# Patient Record
Sex: Male | Born: 1975 | Race: White | Hispanic: No | Marital: Married | Smoking: Current some day smoker
Health system: Southern US, Community
[De-identification: ages and names within clinical notes are randomized; demographics above are authoritative.]

## PROBLEM LIST (undated history)

## (undated) DIAGNOSIS — I471 Supraventricular tachycardia, unspecified: Secondary | ICD-10-CM

## (undated) DIAGNOSIS — G4730 Sleep apnea, unspecified: Secondary | ICD-10-CM

## (undated) HISTORY — PX: OTHER SURGICAL HISTORY: SHX169

---

## 2014-04-27 ENCOUNTER — Emergency Department: Payer: Self-pay | Admitting: Emergency Medicine

## 2014-05-06 ENCOUNTER — Emergency Department: Payer: Self-pay | Admitting: Emergency Medicine

## 2014-05-14 ENCOUNTER — Emergency Department: Payer: Self-pay | Admitting: Emergency Medicine

## 2014-05-16 ENCOUNTER — Emergency Department: Payer: Self-pay | Admitting: Emergency Medicine

## 2014-05-24 ENCOUNTER — Emergency Department: Payer: Self-pay | Admitting: Emergency Medicine

## 2014-05-30 ENCOUNTER — Emergency Department: Payer: Self-pay | Admitting: Emergency Medicine

## 2014-07-17 ENCOUNTER — Emergency Department: Payer: Self-pay | Admitting: Emergency Medicine

## 2014-09-03 ENCOUNTER — Emergency Department: Payer: Self-pay | Admitting: Emergency Medicine

## 2014-09-26 ENCOUNTER — Emergency Department: Payer: Self-pay | Admitting: Emergency Medicine

## 2014-10-18 ENCOUNTER — Emergency Department: Payer: Self-pay | Admitting: Emergency Medicine

## 2015-01-06 ENCOUNTER — Emergency Department
Admit: 2015-01-06 | Disposition: A | Discharge status: Home / Self Care | Payer: Self-pay | Admitting: Physician Assistant

## 2015-04-23 ENCOUNTER — Emergency Department
Admission: EM | Admit: 2015-04-23 | Discharge: 2015-04-23 | Disposition: A | Discharge status: Home / Self Care | Payer: Self-pay | Attending: Emergency Medicine | Admitting: Emergency Medicine

## 2015-04-23 ENCOUNTER — Encounter: Payer: Self-pay | Admitting: Emergency Medicine

## 2015-04-23 DIAGNOSIS — R000 Tachycardia, unspecified: Secondary | ICD-10-CM | POA: Insufficient documentation

## 2015-04-23 DIAGNOSIS — F1210 Cannabis abuse, uncomplicated: Secondary | ICD-10-CM | POA: Insufficient documentation

## 2015-04-23 DIAGNOSIS — Z720 Tobacco use: Secondary | ICD-10-CM | POA: Insufficient documentation

## 2015-04-23 HISTORY — DX: Supraventricular tachycardia: I47.1

## 2015-04-23 HISTORY — DX: Supraventricular tachycardia, unspecified: I47.10

## 2015-04-23 LAB — URINE DRUG SCREEN, QUALITATIVE (ARMC ONLY)
Amphetamines, Ur Screen: NOT DETECTED
Barbiturates, Ur Screen: NOT DETECTED
Benzodiazepine, Ur Scrn: NOT DETECTED
Cannabinoid 50 Ng, Ur ~~LOC~~: POSITIVE — AB
Cocaine Metabolite,Ur ~~LOC~~: NOT DETECTED
MDMA (Ecstasy)Ur Screen: NOT DETECTED
METHADONE SCREEN, URINE: NOT DETECTED
OPIATE, UR SCREEN: NOT DETECTED
Phencyclidine (PCP) Ur S: NOT DETECTED
Tricyclic, Ur Screen: NOT DETECTED

## 2015-04-23 LAB — COMPREHENSIVE METABOLIC PANEL
ALT: 45 U/L (ref 17–63)
AST: 43 U/L — ABNORMAL HIGH (ref 15–41)
Albumin: 3.8 g/dL (ref 3.5–5.0)
Alkaline Phosphatase: 46 U/L (ref 38–126)
Anion gap: 10 (ref 5–15)
BILIRUBIN TOTAL: 0.8 mg/dL (ref 0.3–1.2)
BUN: 12 mg/dL (ref 6–20)
CO2: 26 mmol/L (ref 22–32)
CREATININE: 1.01 mg/dL (ref 0.61–1.24)
Calcium: 8.5 mg/dL — ABNORMAL LOW (ref 8.9–10.3)
Chloride: 101 mmol/L (ref 101–111)
GFR calc non Af Amer: >60 mL/min (ref >60)
Glucose, Bld: 194 mg/dL — ABNORMAL HIGH (ref 65–99)
POTASSIUM: 4.2 mmol/L (ref 3.5–5.1)
Sodium: 137 mmol/L (ref 135–145)
Total Protein: 7.4 g/dL (ref 6.5–8.1)

## 2015-04-23 LAB — CBC
HCT: 48.2 % (ref 40.0–52.0)
Hemoglobin: 16 g/dL (ref 13.0–18.0)
MCH: 28.7 pg (ref 26.0–34.0)
MCHC: 33.3 g/dL (ref 32.0–36.0)
MCV: 86.2 fL (ref 80.0–100.0)
PLATELETS: 270 10*3/uL (ref 150–440)
RBC: 5.59 MIL/uL (ref 4.40–5.90)
RDW: 14.9 % — ABNORMAL HIGH (ref 11.5–14.5)
WBC: 14.2 10*3/uL — ABNORMAL HIGH (ref 3.8–10.6)

## 2015-04-23 LAB — TROPONIN I

## 2015-04-23 LAB — ETHANOL: ALCOHOL ETHYL (B): 154 mg/dL — AB (ref <5)

## 2015-04-23 MED ORDER — SODIUM CHLORIDE 0.9 % IV BOLUS (SEPSIS)
1000.0000 mL | Freq: Once | INTRAVENOUS | Status: AC
  Administered 2015-04-23: 1000 mL via INTRAVENOUS
  Filled 2015-04-23: qty 1000

## 2015-04-23 MED ORDER — KETOROLAC TROMETHAMINE 30 MG/ML IJ SOLN: 30.0000 mg | Freq: Once | INTRAMUSCULAR | Status: DC

## 2015-04-23 MED ORDER — KETOROLAC TROMETHAMINE 30 MG/ML IJ SOLN
INTRAMUSCULAR | Status: AC
  Filled 2015-04-23: qty 1

## 2015-04-23 MED ORDER — LIDOCAINE VISCOUS 2 % MT SOLN
15.0000 mL | Freq: Once | OROMUCOSAL | Status: AC
  Administered 2015-04-23: 15 mL via OROMUCOSAL
  Filled 2015-04-23: qty 15

## 2015-04-23 MED ORDER — KETOROLAC TROMETHAMINE 10 MG PO TABS
10.0000 mg | ORAL_TABLET | Freq: Once | ORAL | Status: AC
  Administered 2015-04-23: 10 mg via ORAL
  Filled 2015-04-23: qty 1
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
## 2015-04-23 NOTE — ED Notes (Signed)
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
## 2015-04-23 NOTE — Discharge Instructions (Signed)
Nonspecific Tachycardia  Tachycardia is a faster than normal heartbeat (more than 100 beats per minute). In adults, the heart normally beats between 60 and 100 times a minute. A fast heartbeat may be a normal response to exercise or stress. It does not necessarily mean that something is wrong. However, sometimes when your heart beats too fast it may not be able to pump enough blood to the rest of your body. This can result in chest pain, shortness of breath, dizziness, and even fainting. Nonspecific tachycardia means that the specific cause or pattern of your tachycardia is unknown.  CAUSES   Tachycardia may be harmless or it may be due to a more serious underlying cause. Possible causes of tachycardia include:  · Exercise or exertion.  · Fever.  · Pain or injury.  · Infection.  · Loss of body fluids (dehydration).  · Overactive thyroid.  · Lack of red blood cells (anemia).  · Anxiety and stress.  · Alcohol.  · Caffeine.  · Tobacco products.  · Diet pills.  · Illegal drugs.  · Heart disease.  SYMPTOMS  · Rapid or irregular heartbeat (palpitations).  · Suddenly feeling your heart beating (cardiac awareness).  · Dizziness.  · Tiredness (fatigue).  · Shortness of breath.  · Chest pain.  · Nausea.  · Fainting.  DIAGNOSIS   Your caregiver will perform a physical exam and take your medical history. In some cases, a heart specialist (cardiologist) may be consulted. Your caregiver may also order:  · Blood tests.  · Electrocardiography. This test records the electrical activity of your heart.  · A heart monitoring test.  TREATMENT   Treatment will depend on the likely cause of your tachycardia. The goal is to treat the underlying cause of your tachycardia. Treatment methods may include:  · Replacement of fluids or blood through an intravenous (IV) tube for moderate to severe dehydration or anemia.  · New medicines or changes in your current medicines.  · Diet and lifestyle changes.  · Treatment for certain  infections.  · Stress relief or relaxation methods.  HOME CARE INSTRUCTIONS   · Rest.  · Drink enough fluids to keep your urine clear or pale yellow.  · Do not smoke.  · Avoid:  ¨ Caffeine.  ¨ Tobacco.  ¨ Alcohol.  ¨ Chocolate.  ¨ Stimulants such as over-the-counter diet pills or pills that help you stay awake.  ¨ Situations that cause anxiety or stress.  ¨ Illegal drugs such as marijuana, phencyclidine (PCP), and cocaine.  · Only take medicine as directed by your caregiver.  · Keep all follow-up appointments as directed by your caregiver.  SEEK IMMEDIATE MEDICAL CARE IF:   · You have pain in your chest, upper arms, jaw, or neck.  · You become weak, dizzy, or feel faint.  · You have palpitations that will not go away.  · You vomit, have diarrhea, or pass blood in your stool.  · Your skin is cool, pale, and wet.  · You have a fever that will not go away with rest, fluids, and medicine.  MAKE SURE YOU:   · Understand these instructions.  · Will watch your condition.  · Will get help right away if you are not doing well or get worse.  Document Released: 10/22/2004 Document Revised: 12/07/2011 Document Reviewed: 08/25/2011  ExitCare® Patient Information ©2015 [employer]. This information is not intended to replace advice given to you by your health care provider. Make sure you discuss any questions   you have with your health care provider.

## 2015-06-04 ENCOUNTER — Encounter: Payer: Self-pay | Admitting: *Deleted

## 2015-06-04 ENCOUNTER — Emergency Department
Admission: EM | Admit: 2015-06-04 | Discharge: 2015-06-04 | Disposition: A | Discharge status: Home / Self Care | Payer: Self-pay | Attending: Emergency Medicine | Admitting: Emergency Medicine

## 2015-06-04 ENCOUNTER — Emergency Department: Payer: Self-pay

## 2015-06-04 DIAGNOSIS — Z720 Tobacco use: Secondary | ICD-10-CM | POA: Insufficient documentation

## 2015-06-04 DIAGNOSIS — R062 Wheezing: Secondary | ICD-10-CM | POA: Insufficient documentation

## 2015-06-04 DIAGNOSIS — R109 Unspecified abdominal pain: Secondary | ICD-10-CM | POA: Insufficient documentation

## 2015-06-04 HISTORY — DX: Sleep apnea, unspecified: G47.30

## 2015-06-04 LAB — COMPREHENSIVE METABOLIC PANEL
ALT: 27 U/L (ref 17–63)
ANION GAP: 8 (ref 5–15)
AST: 22 U/L (ref 15–41)
Albumin: 3.8 g/dL (ref 3.5–5.0)
Alkaline Phosphatase: 47 U/L (ref 38–126)
BUN: 9 mg/dL (ref 6–20)
CO2: 26 mmol/L (ref 22–32)
Calcium: 8.2 mg/dL — ABNORMAL LOW (ref 8.9–10.3)
Chloride: 94 mmol/L — ABNORMAL LOW (ref 101–111)
Creatinine, Ser: 0.78 mg/dL (ref 0.61–1.24)
Glucose, Bld: 125 mg/dL — ABNORMAL HIGH (ref 65–99)
POTASSIUM: 3.7 mmol/L (ref 3.5–5.1)
Sodium: 128 mmol/L — ABNORMAL LOW (ref 135–145)
Total Bilirubin: 0.7 mg/dL (ref 0.3–1.2)
Total Protein: 7.1 g/dL (ref 6.5–8.1)

## 2015-06-04 LAB — CBC
HCT: 47.7 % (ref 40.0–52.0)
Hemoglobin: 15.6 g/dL (ref 13.0–18.0)
MCH: 28 pg (ref 26.0–34.0)
MCHC: 32.8 g/dL (ref 32.0–36.0)
MCV: 85.6 fL (ref 80.0–100.0)
PLATELETS: 269 10*3/uL (ref 150–440)
RBC: 5.58 MIL/uL (ref 4.40–5.90)
RDW: 14.6 % — AB (ref 11.5–14.5)
WBC: 12.2 10*3/uL — AB (ref 3.8–10.6)

## 2015-06-04 LAB — URINALYSIS COMPLETE WITH MICROSCOPIC (ARMC ONLY)
BILIRUBIN URINE: NEGATIVE
Bacteria, UA: NONE SEEN
Glucose, UA: NEGATIVE mg/dL
Ketones, ur: NEGATIVE mg/dL
Leukocytes, UA: NEGATIVE
NITRITE: NEGATIVE
Protein, ur: 30 mg/dL — AB
Specific Gravity, Urine: 1.014 (ref 1.005–1.030)
pH: 7 (ref 5.0–8.0)

## 2015-06-04 LAB — LIPASE, BLOOD: LIPASE: 21 U/L — AB (ref 22–51)

## 2015-06-04 MED ORDER — OXYCODONE-ACETAMINOPHEN 5-325 MG PO TABS
2.0000 | ORAL_TABLET | Freq: Once | ORAL | Status: AC
  Administered 2015-06-04: 2 via ORAL

## 2015-06-04 MED ORDER — HYDROMORPHONE HCL 1 MG/ML IJ SOLN
INTRAMUSCULAR | Status: AC
  Administered 2015-06-04: 1 mg via INTRAMUSCULAR
  Filled 2015-06-04: qty 1

## 2015-06-04 MED ORDER — OXYCODONE-ACETAMINOPHEN 5-325 MG PO TABS
ORAL_TABLET | ORAL | Status: AC
  Filled 2015-06-04: qty 2

## 2015-06-04 MED ORDER — IPRATROPIUM-ALBUTEROL 0.5-2.5 (3) MG/3ML IN SOLN
RESPIRATORY_TRACT | Status: AC
  Administered 2015-06-04: 3 mL via RESPIRATORY_TRACT
  Filled 2015-06-04: qty 3

## 2015-06-04 MED ORDER — HYDROMORPHONE HCL 1 MG/ML IJ SOLN
1.0000 mg | Freq: Once | INTRAMUSCULAR | Status: AC
  Administered 2015-06-04: 1 mg via INTRAMUSCULAR

## 2015-06-04 MED ORDER — ALBUTEROL SULFATE HFA 108 (90 BASE) MCG/ACT IN AERS: 2.0000 | INHALATION_SPRAY | Freq: Four times a day (QID) | RESPIRATORY_TRACT | Status: AC | PRN

## 2015-06-04 MED ORDER — IPRATROPIUM-ALBUTEROL 0.5-2.5 (3) MG/3ML IN SOLN
3.0000 mL | Freq: Once | RESPIRATORY_TRACT | Status: AC
  Administered 2015-06-04: 3 mL via RESPIRATORY_TRACT

## 2015-06-04 MED ORDER — OXYCODONE-ACETAMINOPHEN 5-325 MG PO TABS: 1.0000 | ORAL_TABLET | Freq: Four times a day (QID) | ORAL | Status: AC | PRN

## 2015-06-04 NOTE — Discharge Instructions (Signed)
Flank Pain °Flank pain refers to pain that is located on the side of the body between the upper abdomen and the back. The pain may occur over a short period of time (acute) or may be long-term or reoccurring (chronic). It may be mild or severe. Flank pain can be caused by many things. °CAUSES  °Some of the more common causes of flank pain include: °· Muscle strains.   °· Muscle spasms.   °· A disease of your spine (vertebral disk disease).   °· A lung infection (pneumonia).   °· Fluid around your lungs (pulmonary edema).   °· A kidney infection.   °· Kidney stones.   °· A very painful skin rash caused by the chickenpox virus (shingles).   °· Gallbladder disease.   °HOME CARE INSTRUCTIONS  °Home care will depend on the cause of your pain. In general, °· Rest as directed by your caregiver. °· Drink enough fluids to keep your urine clear or pale yellow. °· Only take over-the-counter or prescription medicines as directed by your caregiver. Some medicines may help relieve the pain. °· Tell your caregiver about any changes in your pain. °· Follow up with your caregiver as directed. °SEEK IMMEDIATE MEDICAL CARE IF:  °· Your pain is not controlled with medicine.   °· You have new or worsening symptoms. °· Your pain increases.   °· You have abdominal pain.   °· You have shortness of breath.   °· You have persistent nausea or vomiting.   °· You have swelling in your abdomen.   °· You feel faint or pass out.   °· You have blood in your urine. °· You have a fever or persistent symptoms for more than 2-3 days. °· You have a fever and your symptoms suddenly get worse. °MAKE SURE YOU:  °· Understand these instructions. °· Will watch your condition. °· Will get help right away if you are not doing well or get worse. °Document Released: 11/05/2005 Document Revised: 06/08/2012 Document Reviewed: 04/28/2012 °ExitCare® Patient Information ©2015 [employer]. This information is not intended to replace advice given to you by your  health care provider. Make sure you discuss any questions you have with your health care provider. ° °

## 2015-06-04 NOTE — ED Notes (Signed)
Pt reports right flank pain radiating to side starting yesterday

## 2015-06-04 NOTE — ED Provider Notes (Signed)
[facility] Emergency Department Provider Note  Time seen: 5:19 PM  I have reviewed the triage vital signs and the nursing notes.   HISTORY  Chief Complaint Flank Pain    HPI Paul Turner is a 39 y.o. male with a past medical history of sleep apnea, SVT, presents the emergency department left flank pain. According to the patient he awoke around 3 AM with severe left flank pain, worse when he coughs. Patient denies any fever, dysuria, hematuria, diarrhea. States the pain is mild to moderate constantly, but becomes severe and sharp when he coughs or moves a certain way. He is not sure if he did something to it while he was sleeping. Denies any history of kidney stones.Describes the pain as 5/10, but 10/10 when he coughs. States nausea, denies vomiting.     Past Medical History  Diagnosis Date  . SVT (supraventricular tachycardia)   . Sleep apnea     There are no active problems to display for this patient.   Past Surgical History  Procedure Laterality Date  . Cardiac ablation      No current outpatient prescriptions on file.  Allergies Review of patient's allergies indicates no known allergies.  No family history on file.  Social History Social History  Substance Use Topics  . Smoking status: Current Some Day Smoker -- 0.50 packs/day    Types: Cigarettes  . Smokeless tobacco: Never Used  . Alcohol Use: Yes    Review of Systems Constitutional: Negative for fever. Cardiovascular: Negative for chest pain. Respiratory: Negative for shortness of breath. Gastrointestinal: Positive for left flank pain. Genitourinary: Negative for dysuria. Negative for hematuria. Musculoskeletal: Negative for back pain. Neurological: Negative for headache 10-point ROS otherwise negative.  ____________________________________________   PHYSICAL EXAM:  VITAL SIGNS: ED Triage Vitals  Enc Vitals Group     BP 06/04/15 1635 149/111 mmHg     Pulse Rate  06/04/15 1635 88     Resp 06/04/15 1635 22     Temp 06/04/15 1635 98.4 F (36.9 C)     Temp Source 06/04/15 1635 Oral     SpO2 06/04/15 1635 96 %     Weight 06/04/15 1635 340 lb (154.223 kg)     Height 06/04/15 1635 6' (1.829 m)     Head Cir --      Peak Flow --      Pain Score 06/04/15 1636 10     Pain Loc --      Pain Edu? --      Excl. in GC? --     Constitutional: Alert and oriented. Well appearing and in no distress. Eyes: Normal exam ENT   Head: Normocephalic and atraumatic. Cardiovascular: Normal rate, regular rhythm. No murmur Respiratory: Patient with bilateral wheeze, especially expiratory. No rales or rhonchi identified. Gastrointestinal: Soft and nontender. No distention. No CVA tenderness palpation. Musculoskeletal: Nontender with normal range of motion in all extremities. No lower extremity tenderness or edema. Neurologic:  Normal speech and language. No gross focal neurologic deficits Skin:  Skin is warm, dry and intact.  Psychiatric: Mood and affect are normal. Speech and behavior are normal. ____________________________________________    RADIOLOGY  CT shows no acute findings.  ____________________________________________   INITIAL IMPRESSION / ASSESSMENT AND PLAN / ED COURSE  Pertinent labs & imaging results that were available during my care of the patient were reviewed by me and considered in my medical decision making (see chart for details).  Patient with significant left flank pain since  3 AM. Nontender exam, no CVA tenderness. No abdominal tenderness or back tenderness. We will proceed with labs, CT abdomen and pelvis to help further evaluate. Patient agreeable to plan. We will dose Dilaudid IM for pain relief. We will also provide a DuoNeb given bilateral wheeze on exam. The patient admits smoking, does not know if he has COPD because he does not see a physician.  CT shows no acute findings. We will discharge home with Percocet for pain control,  albuterol for wheeze, and patient is follow up with a primary care doctor. I discussed strict return precautions with the patient for worsening pain, fever, vomiting, patient is agreeable to plan. ____________________________________________   FINAL CLINICAL IMPRESSION(S) / ED DIAGNOSES  Left flank pain Wheeze   Minna Antis, MD 06/04/15 [phone]

## 2015-06-04 NOTE — ED Notes (Signed)
Pt reports spilling duoneb and is requesting another at this time. Pt is also requesting a nicatin patch at this time.

## 2015-06-04 NOTE — ED Notes (Signed)
Pt reports LUQ pain beginning in the middle of the night. Pt reports pain is now radiating to the Left back. Pt denies pain with urination but reports pain increases with coughing. Pt has bilateral wheezing and SOB that pt reports he has had for the past "20 years". Pt is in no acute distress but is coughing at this time.

## 2015-10-01 ENCOUNTER — Emergency Department
Admission: EM | Admit: 2015-10-01 | Discharge: 2015-10-01 | Disposition: A | Discharge status: Home / Self Care | Payer: Self-pay | Attending: Emergency Medicine | Admitting: Emergency Medicine

## 2015-10-01 ENCOUNTER — Emergency Department: Payer: Self-pay

## 2015-10-01 ENCOUNTER — Encounter: Payer: Self-pay | Admitting: Emergency Medicine

## 2015-10-01 DIAGNOSIS — W06XXXA Fall from bed, initial encounter: Secondary | ICD-10-CM | POA: Insufficient documentation

## 2015-10-01 DIAGNOSIS — R0981 Nasal congestion: Secondary | ICD-10-CM

## 2015-10-01 DIAGNOSIS — G4730 Sleep apnea, unspecified: Secondary | ICD-10-CM

## 2015-10-01 DIAGNOSIS — S0990XA Unspecified injury of head, initial encounter: Secondary | ICD-10-CM | POA: Insufficient documentation

## 2015-10-01 DIAGNOSIS — R519 Headache, unspecified: Secondary | ICD-10-CM

## 2015-10-01 DIAGNOSIS — K029 Dental caries, unspecified: Secondary | ICD-10-CM

## 2015-10-01 DIAGNOSIS — Y9289 Other specified places as the place of occurrence of the external cause: Secondary | ICD-10-CM | POA: Insufficient documentation

## 2015-10-01 DIAGNOSIS — R51 Headache: Secondary | ICD-10-CM

## 2015-10-01 DIAGNOSIS — Y9389 Activity, other specified: Secondary | ICD-10-CM | POA: Insufficient documentation

## 2015-10-01 DIAGNOSIS — F17210 Nicotine dependence, cigarettes, uncomplicated: Secondary | ICD-10-CM | POA: Insufficient documentation

## 2015-10-01 DIAGNOSIS — S0121XA Laceration without foreign body of nose, initial encounter: Secondary | ICD-10-CM | POA: Insufficient documentation

## 2015-10-01 DIAGNOSIS — Y998 Other external cause status: Secondary | ICD-10-CM | POA: Insufficient documentation

## 2015-10-01 DIAGNOSIS — J029 Acute pharyngitis, unspecified: Secondary | ICD-10-CM | POA: Insufficient documentation

## 2015-10-01 MED ORDER — FEXOFENADINE-PSEUDOEPHED ER 60-120 MG PO TB12: 1.0000 | ORAL_TABLET | Freq: Two times a day (BID) | ORAL | Status: AC

## 2015-10-01 MED ORDER — TRAMADOL HCL 50 MG PO TABS
50.0000 mg | ORAL_TABLET | Freq: Once | ORAL | Status: AC
  Administered 2015-10-01: 50 mg via ORAL
  Filled 2015-10-01: qty 1

## 2015-10-01 MED ORDER — IBUPROFEN 800 MG PO TABS: 800.0000 mg | ORAL_TABLET | Freq: Three times a day (TID) | ORAL | Status: AC | PRN

## 2015-10-01 MED ORDER — IBUPROFEN 800 MG PO TABS
800.0000 mg | ORAL_TABLET | Freq: Once | ORAL | Status: AC
  Administered 2015-10-01: 800 mg via ORAL
  Filled 2015-10-01: qty 1

## 2015-10-01 MED ORDER — TRAMADOL HCL 50 MG PO TABS: 50.0000 mg | ORAL_TABLET | Freq: Four times a day (QID) | ORAL | Status: AC | PRN

## 2015-10-01 MED ORDER — AMOXICILLIN 500 MG PO CAPS: 500.0000 mg | ORAL_CAPSULE | Freq: Three times a day (TID) | ORAL | Status: AC

## 2015-10-01 NOTE — Discharge Instructions (Signed)
Follow-up from the list of dental clinics provided. °OPTIONS FOR DENTAL FOLLOW UP CARE ° °[place] [facility] Health and Human Services - Local Safety Net Dental Clinics °http://www.ncdhhs.gov/dph/oralhealth/services/safetynetclinics.htm °  °[facility] (336-562-3123) ° °[facility] [place] ([phone]) ° °[facility] ([phone] ext [phone]) ° °[facility] ([phone]) ° °[facility] ([phone]) °This clinic caters to the indigent population and is on a lottery system. °Location: °[facility], [address], [place] °Clinic Hours: °Wednesdays from 6pm - 9pm, patients seen by a lottery system. °For dates, call or go to www.med.[facility].edu/[facility]/patients/Dental-[facility] °Services: °Cleanings, fillings and simple extractions. °Payment Options: °DENTAL WORK IS FREE OF CHARGE. Bring proof of income or support. °Best way to get seen: °Arrive at 5:15 pm - this is a lottery, NOT first come/first serve, so arriving earlier will not increase your chances of being seen. °  °  °[facility] °[phone] °Select option 1 for emergencies °  °Location: °[facility], [address], [place] °Clinic Hours: °No walk-ins accepted - call the day before to schedule an appointment. °Check in times are 9:30 am and 1:30 pm. °Services: °Simple extractions, temporary fillings, pulpectomy/pulp debridement, uncomplicated abscess drainage. °Payment Options: °PAYMENT IS DUE AT THE TIME OF SERVICE.  Fee is usually $100-200, additional surgical procedures (e.g. abscess drainage) may be extra. °Cash, checks, Visa/MasterCard accepted.  Can file Medicaid if patient is covered for dental - patient should call case worker to check. °No discount for [facility] patients. °Best way to get seen: °MUST call the day before and get onto the schedule. Can usually be seen the next 1-2 days. No walk-ins accepted. °  °   °[facility] °[phone] °  °Location: °[facility], [address], [place] °Clinic Hours: °M, W, Th, F 8am or 1:30pm, Tues 9a or 1:30 - first come/first served. °Services: °Simple extractions, temporary fillings, uncomplicated abscess drainage.  You do not need to be an [place] resident. °Payment Options: °PAYMENT IS DUE AT THE TIME OF SERVICE. °Dental insurance, otherwise sliding scale - bring proof of income or support. °Depending on income and treatment needed, cost is usually $50-200. °Best way to get seen: °Arrive early as it is first come/first served. °  °  °[facility] °[phone] °  °Location: °[address] °Clinic Hours: °Mon-Thu 8a-5p °Services: °Most basic dental services including extractions and fillings. °Payment Options: °PAYMENT IS DUE AT THE TIME OF SERVICE. °Sliding scale, up to 50% off - bring proof if income or support. °Medicaid with dental option accepted. °Best way to get seen: °Call to schedule an appointment, can usually be seen within 2 weeks OR they will try to see walk-ins - show up at 8a or 2p (you may have to wait). °  °  °[facility] °[phone] °[place] RESIDENTS ONLY °  °Location: °[facility], [address], [place], [place] [address] °Clinic Hours: By appointment only. °Monday - Thursday 8am-5pm, Friday 8am-12pm °Services: Cleanings, fillings, extractions. °Payment Options: °PAYMENT IS DUE AT THE TIME OF SERVICE. °Cash, Visa or MasterCard. Sliding scale - $30 minimum per service. °Best way to get seen: °Come in to office, complete packet and make an appointment - need proof of income °or support monies for each household member and proof of [place] residence. °Usually takes about a month to get in. °  °  °[facility] °[phone] °  °Location: °[address] [place] °Clinic Hours: Walk-in Urgent Care  Dental Services are offered Monday-Friday mornings only. °The numbers of emergencies accepted daily is limited to the number of °providers available. °Maximum 15 - Mondays, Wednesdays & Thursdays °Maximum 10 - Tuesdays & Fridays °Services: °You do not need to be a [place] resident to be seen for a dental emergency. °Emergencies are defined as pain, swelling, abnormal bleeding, or dental trauma. Walkins will receive x-rays if needed. °NOTE: Dental cleaning is not an emergency. °Payment Options: °PAYMENT IS DUE AT THE TIME OF SERVICE. °Minimum co-pay is $40.00 for uninsured patients. °Minimum co-pay is $3.00 for Medicaid with dental coverage. °Dental Insurance is accepted and must be presented at time of visit. °Medicare does not cover dental. °Forms of payment: Cash, credit card, checks. °Best way to get seen: °If not previously registered with the clinic, walk-in dental registration begins at 7:15 am and is on a first come/first serve basis. °If previously registered with the clinic, call to make an appointment. °  °  °[facility] °[phone] °[place] RESIDENTS ONLY °  °Location: °[address], [place], [place] °Clinic Hours: °Mon-Thu 10a-2p °Services: Extractions only! °Payment Options: °FREE (donations accepted) - bring proof of income or support °Best way to get seen: °Call and schedule an appointment OR come at 8am on the 1st Monday of every month (except for holidays) when it is first come/first served. °  °  °[facility] °[phone] °  °Location: °[address], [place] °Clinic Hours: °Friday mornings °Services, Payment Options, Best way to get seen: °Call for info ° °

## 2015-10-01 NOTE — ED Notes (Signed)
Pt reports to ED w/ multiple problems that he would like to be seen for.  Pt sts that he's had pneumonia for 3 weeks, coughing up yellow phlegm and that he gets "goat throat".  Pt also c/o sleep issues, stating that he's only half asleep.  Pt also sts that he has a rotten tooth and that it is causing pain to R side of face.  Pts denies seeing PCP or dentist for issues.

## 2015-10-01 NOTE — ED Notes (Signed)
Pt here with c/o cough for 3 weeks, thick mucus sputum, fell out of bed last pm, skin tear scabbed over noted to bridge of nose, tooth ache past few weeks as well.

## 2015-10-01 NOTE — ED Provider Notes (Signed)
[facility] Emergency Department Provider Note  ____________________________________________  Time seen: Approximately 3:43 PM  I have reviewed the triage vital signs and the nursing notes.   HISTORY  Chief Complaint Cough and Fall    HPI Paul Turner is a 40 y.o. male patient presented with multiple complaints. Patient complain of a laceration superior aspect of his nose  secondary fall out of be an early this morning. Patient denies any loss of consciousness or nasal hemorrhaging. Patient denies any difficulty breathing. Patient states productive cough for 3 weeks and sore throat. Patient also states insomnia. Patient has dental pain secondary to a devitalized teeth.Patient rates his pain discomfort as a 10 over 10. No palliative measures for these complaints. Patient has not seen family doctor clinic or dentist for these complaints.  Past Medical History  Diagnosis Date  . SVT (supraventricular tachycardia) (HCC)   . Sleep apnea     There are no active problems to display for this patient.   Past Surgical History  Procedure Laterality Date  . Cardiac ablation      Current Outpatient Rx  Name  Route  Sig  Dispense  Refill  . albuterol (PROVENTIL HFA;VENTOLIN HFA) 108 (90 BASE) MCG/ACT inhaler   Inhalation   Inhale 2 puffs into the lungs every 6 (six) hours as needed for wheezing or shortness of breath.   1 Inhaler   2   . amoxicillin (AMOXIL) 500 MG capsule   Oral   Take 1 capsule (500 mg total) by mouth 3 (three) times daily.   30 capsule   0   . fexofenadine-pseudoephedrine (ALLEGRA-D) 60-120 MG 12 hr tablet   Oral   Take 1 tablet by mouth 2 (two) times daily.   30 tablet   0   . ibuprofen (ADVIL,MOTRIN) 800 MG tablet   Oral   Take 1 tablet (800 mg total) by mouth every 8 (eight) hours as needed.   30 tablet   0   . oxyCODONE-acetaminophen (ROXICET) 5-325 MG per tablet   Oral   Take 1 tablet by mouth every 6 (six) hours as  needed.   20 tablet   0   . traMADol (ULTRAM) 50 MG tablet   Oral   Take 1 tablet (50 mg total) by mouth every 6 (six) hours as needed.   20 tablet   0     Allergies Review of patient's allergies indicates no known allergies.  No family history on file.  Social History Social History  Substance Use Topics  . Smoking status: Current Some Day Smoker -- 0.50 packs/day    Types: Cigarettes  . Smokeless tobacco: Never Used  . Alcohol Use: Yes    Review of Systems Constitutional: No fever/chills Eyes: No visual changes. ENT: Sore throat and dental pain. Nasal congestion and laceration Cardiovascular: Denies chest pain. Respiratory: Denies shortness of breath. Productive cough Gastrointestinal: No abdominal pain.  No nausea, no vomiting.  No diarrhea.  No constipation. Genitourinary: Negative for dysuria. Musculoskeletal: Negative for back pain. Skin: Negative for rash. Laceration superior aspect nasal area Neurological: Negative for headaches, focal weakness or numbness. 10-point ROS otherwise negative.  ____________________________________________   PHYSICAL EXAM:  VITAL SIGNS: ED Triage Vitals  Enc Vitals Group     BP 10/01/15 1506 145/91 mmHg     Pulse Rate 10/01/15 1506 90     Resp 10/01/15 1506 20     Temp 10/01/15 1506 98.8 F (37.1 C)     Temp Source 10/01/15 1506  Oral     SpO2 10/01/15 1506 96 %     Weight 10/01/15 1506 320 lb (145.151 kg)     Height 10/01/15 1506 6' (1.829 m)     Head Cir --      Peak Flow --      Pain Score 10/01/15 1507 10     Pain Loc --      Pain Edu? --      Excl. in GC? --     Constitutional: Alert and oriented. Well appearing and in no acute distress. Morbid obesity Eyes: Conjunctivae are normal. PERRL. EOMI. Head: Atraumatic. Nose: No congestion/rhinnorhea. Laceration superior aspect. Bilateral edematous nasal turbinates. Clear rhinorrhea Mouth/Throat: Mucous membranes are moist.  Oropharynx non-erythematous. Devitalized  tooth #3. Neck: No stridor.  No cervical spine tenderness to palpation. Hematological/Lymphatic/Immunilogical: No cervical lymphadenopathy. Cardiovascular: Normal rate, regular rhythm. Grossly normal heart sounds.  Good peripheral circulation. Respiratory: Normal respiratory effort.  No retractions. Lungs CTAB. Nonproductive cough Gastrointestinal: Soft and nontender. No distention. No abdominal bruits. No CVA tenderness. Musculoskeletal: No lower extremity tenderness nor edema.  No joint effusions. Neurologic:  Normal speech and language. No gross focal neurologic deficits are appreciated. No gait instability. Skin:  Skin is warm, dry and intact. No rash noted. Psychiatric: Mood and affect are normal. Speech and behavior are normal.  ____________________________________________   LABS (all labs ordered are listed, but only abnormal results are displayed)  Labs Reviewed - No data to display ____________________________________________  EKG   ____________________________________________  RADIOLOGY  No acute findings on chest x-ray ____________________________________________   PROCEDURES  Procedure(s) performed: None  Critical Care performed: No  ____________________________________________   INITIAL IMPRESSION / ASSESSMENT AND PLAN / ED COURSE  Pertinent labs & imaging results that were available during my care of the patient were reviewed by me and considered in my medical decision making (see chart for details).  Nasal laceration. Sinus congestion. Dental pain secondary to caries. Negative findings on chest x-ray was discussed with patient.. ____________________________________________   FINAL CLINICAL IMPRESSION(S) / ED DIAGNOSES  Final diagnoses:  Sinus congestion  Sinus headache  Pain due to dental caries  Sleep apnea in adult      Joni ReiningRonald K Melisa Donofrio, PA-C 10/01/15 1626  Darien Ramusavid W Kaminski, MD 10/01/15 [phone]

## 2016-01-27 ENCOUNTER — Emergency Department: Payer: Self-pay

## 2016-01-27 ENCOUNTER — Encounter: Payer: Self-pay | Admitting: Emergency Medicine

## 2016-01-27 ENCOUNTER — Emergency Department
Admission: EM | Admit: 2016-01-27 | Discharge: 2016-01-27 | Disposition: A | Discharge status: Home / Self Care | Payer: Self-pay | Attending: Emergency Medicine | Admitting: Emergency Medicine

## 2016-01-27 DIAGNOSIS — F17210 Nicotine dependence, cigarettes, uncomplicated: Secondary | ICD-10-CM | POA: Insufficient documentation

## 2016-01-27 DIAGNOSIS — F1290 Cannabis use, unspecified, uncomplicated: Secondary | ICD-10-CM | POA: Insufficient documentation

## 2016-01-27 DIAGNOSIS — R109 Unspecified abdominal pain: Secondary | ICD-10-CM | POA: Insufficient documentation

## 2016-01-27 DIAGNOSIS — Z79899 Other long term (current) drug therapy: Secondary | ICD-10-CM | POA: Insufficient documentation

## 2016-01-27 LAB — CBC WITH DIFFERENTIAL/PLATELET
Basophils Absolute: 0.1 10*3/uL (ref 0–0.1)
Basophils Relative: 1 %
Eosinophils Absolute: 0.2 10*3/uL (ref 0–0.7)
Eosinophils Relative: 2 %
HEMATOCRIT: 46.2 % (ref 40.0–52.0)
Hemoglobin: 15 g/dL (ref 13.0–18.0)
LYMPHS PCT: 16 %
Lymphs Abs: 1.8 10*3/uL (ref 1.0–3.6)
MCH: 27.5 pg (ref 26.0–34.0)
MCHC: 32.6 g/dL (ref 32.0–36.0)
MCV: 84.5 fL (ref 80.0–100.0)
Monocytes Absolute: 1 10*3/uL (ref 0.2–1.0)
Monocytes Relative: 9 %
NEUTROS PCT: 72 %
Neutro Abs: 8.1 10*3/uL — ABNORMAL HIGH (ref 1.4–6.5)
Platelets: 290 10*3/uL (ref 150–440)
RBC: 5.46 MIL/uL (ref 4.40–5.90)
RDW: 14.8 % — AB (ref 11.5–14.5)
WBC: 11.2 10*3/uL — ABNORMAL HIGH (ref 3.8–10.6)

## 2016-01-27 LAB — COMPREHENSIVE METABOLIC PANEL
ALT: 43 U/L (ref 17–63)
AST: 35 U/L (ref 15–41)
Albumin: 3.9 g/dL (ref 3.5–5.0)
Alkaline Phosphatase: 47 U/L (ref 38–126)
Anion gap: 7 (ref 5–15)
BILIRUBIN TOTAL: 0.9 mg/dL (ref 0.3–1.2)
BUN: 10 mg/dL (ref 6–20)
CALCIUM: 8.5 mg/dL — AB (ref 8.9–10.3)
CO2: 29 mmol/L (ref 22–32)
Chloride: 101 mmol/L (ref 101–111)
Creatinine, Ser: 0.76 mg/dL (ref 0.61–1.24)
GFR calc Af Amer: >60 mL/min (ref >60)
Glucose, Bld: 135 mg/dL — ABNORMAL HIGH (ref 65–99)
POTASSIUM: 4.1 mmol/L (ref 3.5–5.1)
Sodium: 137 mmol/L (ref 135–145)
TOTAL PROTEIN: 7.3 g/dL (ref 6.5–8.1)

## 2016-01-27 LAB — URINALYSIS COMPLETE WITH MICROSCOPIC (ARMC ONLY)
BILIRUBIN URINE: NEGATIVE
Bacteria, UA: NONE SEEN
GLUCOSE, UA: NEGATIVE mg/dL
KETONES UR: NEGATIVE mg/dL
Nitrite: NEGATIVE
PROTEIN: 100 mg/dL — AB
SPECIFIC GRAVITY, URINE: 1.017 (ref 1.005–1.030)
pH: 8 (ref 5.0–8.0)

## 2016-01-27 MED ORDER — ACETAMINOPHEN 500 MG PO TABS
1000.0000 mg | ORAL_TABLET | ORAL | Status: DC
  Filled 2016-01-27: qty 2

## 2016-01-27 MED ORDER — IBUPROFEN 800 MG PO TABS
800.0000 mg | ORAL_TABLET | ORAL | Status: DC
  Filled 2016-01-27: qty 1

## 2016-01-27 NOTE — ED Notes (Addendum)
Pt refused to sign discharge, refused to let staff get vitals. Pt walked out. Pt ambulatory to discharge with no difficulty noted; pt with even and nonlabored respirations noted.

## 2016-01-27 NOTE — ED Notes (Signed)
Pt does njot have a pcp and no insuance.

## 2016-01-27 NOTE — ED Provider Notes (Signed)
[facility] Emergency Department Provider Note  ____________________________________________  Time seen: Approximately 4:00 PM  I have reviewed the triage vital signs and the nursing notes.   HISTORY  Chief Complaint Flank Pain    HPI Valon Glasscock is a 40 y.o. male previous history of SVT. Also morbid obesity.  Patient reports that he's been having aching pain in his left back and left flank for several days now. Worse with sitting up, twisting and moving. He has not noticed any blood in his urine. No nausea or vomiting. He states he really doesn't feel like any discomfort in his abdomen, rather it is mostly along the muscles of the left back. There is no ripping tearing or movement of the pain. No numbness or weakness in the legs or hands.  No fevers.   Past Medical History  Diagnosis Date  . SVT (supraventricular tachycardia) (HCC)   . Sleep apnea     There are no active problems to display for this patient.   Past Surgical History  Procedure Laterality Date  . Cardiac ablation      Current Outpatient Rx  Name  Route  Sig  Dispense  Refill  . albuterol (PROVENTIL HFA;VENTOLIN HFA) 108 (90 BASE) MCG/ACT inhaler   Inhalation   Inhale 2 puffs into the lungs every 6 (six) hours as needed for wheezing or shortness of breath.   1 Inhaler   2   . amoxicillin (AMOXIL) 500 MG capsule   Oral   Take 1 capsule (500 mg total) by mouth 3 (three) times daily.   30 capsule   0   . fexofenadine-pseudoephedrine (ALLEGRA-D) 60-120 MG 12 hr tablet   Oral   Take 1 tablet by mouth 2 (two) times daily.   30 tablet   0   . ibuprofen (ADVIL,MOTRIN) 800 MG tablet   Oral   Take 1 tablet (800 mg total) by mouth every 8 (eight) hours as needed.   30 tablet   0   . oxyCODONE-acetaminophen (ROXICET) 5-325 MG per tablet   Oral   Take 1 tablet by mouth every 6 (six) hours as needed.   20 tablet   0   . traMADol (ULTRAM) 50 MG tablet   Oral   Take 1  tablet (50 mg total) by mouth every 6 (six) hours as needed.   20 tablet   0     Allergies Review of patient's allergies indicates no known allergies.  History reviewed. No pertinent family history.  Social History Social History  Substance Use Topics  . Smoking status: Current Some Day Smoker -- 0.50 packs/day    Types: Cigarettes  . Smokeless tobacco: Never Used  . Alcohol Use: Yes    Review of Systems Constitutional: No fever/chills Eyes: No visual changes. ENT: No sore throat. Cardiovascular: Denies chest pain. Respiratory: Denies shortness of breath. Gastrointestinal: No abdominal pain.  No nausea, no vomiting.  No diarrhea.  No constipation. Genitourinary: Negative for dysuria. Musculoskeletal: Negative for low or upper back pain. Some pain and discomfort in the left flank along the side of the back. Skin: Negative for rash. Neurological: Negative for headaches, focal weakness or numbness.  10-point ROS otherwise negative.  ____________________________________________   PHYSICAL EXAM:  VITAL SIGNS: ED Triage Vitals  Enc Vitals Group     BP 01/27/16 1247 126/95 mmHg     Pulse Rate 01/27/16 1247 83     Resp 01/27/16 1247 20     Temp 01/27/16 1247 98.2 F (36.8  C)     Temp Source 01/27/16 1247 Oral     SpO2 01/27/16 1247 100 %     Weight 01/27/16 1247 370 lb (167.831 kg)     Height 01/27/16 1247 6' (1.829 m)     Head Cir --      Peak Flow --      Pain Score 01/27/16 1247 10     Pain Loc --      Pain Edu? --      Excl. in GC? --    Constitutional: Alert and oriented. Well appearing and in no acute distress. Resting in the bed, easily aroused. Pleasant. Eyes: Conjunctivae are normal. PERRL. EOMI. Head: Atraumatic. Nose: No congestion/rhinnorhea. Mouth/Throat: Mucous membranes are moist.  Oropharynx non-erythematous. Neck: No stridor.   Cardiovascular: Normal rate, regular rhythm. Grossly normal heart sounds.  Good peripheral circulation. Respiratory:  Normal respiratory effort.  No retractions. Lungs CTAB. Gastrointestinal: Morbid obesity. Soft reducible ventral hernia. Nontender. Soft and nontender. No distention. No abdominal bruits. No CVA tenderness. The patient has a fairly large pannus, however no evidence of erythema or abscess. The testicles are nontender bilaterally. No groin mass. Musculoskeletal: No lower extremity tenderness nor edema.  No joint effusions. There is modest tenderness along the left thoracic and upper lumbar paraspinous muscles. Patient's pain is worse when he goes to sit up, twist or stand. Neurologic:  Normal speech and language. No gross focal neurologic deficits are appreciated. No gait instability. Skin:  Skin is warm, dry and intact. No rash noted. Psychiatric: Mood and affect are normal. Speech and behavior are normal.  ____________________________________________   LABS (all labs ordered are listed, but only abnormal results are displayed)  Labs Reviewed  CBC WITH DIFFERENTIAL/PLATELET - Abnormal; Notable for the following:    WBC 11.2 (*)    RDW 14.8 (*)    Neutro Abs 8.1 (*)    All other components within normal limits  COMPREHENSIVE METABOLIC PANEL - Abnormal; Notable for the following:    Glucose, Bld 135 (*)    Calcium 8.5 (*)    All other components within normal limits  URINALYSIS COMPLETEWITH MICROSCOPIC ([facility] ONLY) - Abnormal; Notable for the following:    Color, Urine YELLOW (*)    APPearance CLEAR (*)    Hgb urine dipstick 1+ (*)    Protein, ur 100 (*)    Leukocytes, UA 1+ (*)    Squamous Epithelial / LPF 0-5 (*)    All other components within normal limits  URINE CULTURE   ____________________________________________  EKG  ____________________________________________  RADIOLOGY   DG Abd 1 View (Final result) Result time: 01/27/16 16:43:16   Final result by Rad Results In Interface (01/27/16 16:43:16)   Narrative:   CLINICAL DATA: Reports left flank pain x 2  days  EXAM: ABDOMEN - 1 VIEW  COMPARISON: None.  FINDINGS: The bowel gas pattern is normal. No radio-opaque calculi or other significant radiographic abnormality are seen.  IMPRESSION: Negative.   Electronically Signed By: Esperanza Heiraymond Rubner M.D. On: 01/27/2016 16:43        ____________________________________________   PROCEDURES  Procedure(s) performed: None  Critical Care performed: No  ____________________________________________   INITIAL IMPRESSION / ASSESSMENT AND PLAN / ED COURSE  Pertinent labs & imaging results that were available during my care of the patient were reviewed by me and considered in my medical decision making (see chart for details).  Patient presents with left flank discomfort. Symptoms appear to be most consistent with musculoskeletal type pain. Discussed with  the patient, and we also discussed further evaluation such as obtaining a CT scan to evaluate for other causes such as abdominal pain, kidney stones, infection, ulcer, etc. however the patient does not fit in our CT scan as his weight is greater than the table limit. He is agreeable with the plan to treat his discomfort with over-the-counter medication, rest and watchful waiting. Should he develop abdominal pain, fevers, worsening symptoms, numbness weakness chest pain or other new concerns arise he'll return to emergency room right away.  No ripping or tearing or moving pain to suggest dissection. Normal peripheral pulses. Patient is young, no midline back or abdominal pain to suggest possible or aneurysm. No fever, mild but appears to be chronic leukocytosis. No right lower quadrant pain or right upper quadrant discomfort. No surgical signs or symptoms on examination. No evidence of cholecystitis, cholelithiasis by exam, appendicitis, or other clinical history to suggest acute intra-abdominal catastrophe or infection.  ----------------------------------------- 5:27 PM on  01/27/2016 -----------------------------------------  Patient ambulating well, reports some relief. On repeat exam no tenderness along the left flank or abdomen, does have tenderness along the left posterior paraspinous muscles. I discussed with him that to further evaluate we would require further imaging like a CT scan, but due to his weight we would need to transfer him to [facility] to perform this. He did not wish to do this, and I think it is reasonable to provide him with careful return precautions which we reviewed he will come back right away if worsening symptoms or new concerns.  He is currently awake, alert and ambulating without distress in the ER. He was able to eat crackers and water without problem. ____________________________________________   FINAL CLINICAL IMPRESSION(S) / ED DIAGNOSES  Final diagnoses:  Acute left flank pain      Sharyn Creamer, MD 01/27/16 1740

## 2016-01-27 NOTE — ED Notes (Signed)
Reports left flank pain.  Denies vomiting.

## 2016-01-27 NOTE — Discharge Instructions (Signed)
You were seen in the emergency room for pain along the left flank and back. It is important that you follow up closely with your primary care doctor in the next couple of days.  If you're unable to see a primary care doctor you may return to the emergency room or go to the [facility] in 1 or 2 days for reexam.  Please return to the emergency room right away if you are to develop a fever, severe nausea, your pain becomes severe or worsens, you are unable to keep food down, begin vomiting any dark or bloody fluid, you develop any dark or bloody stools, feel dehydrated, or other new concerns or symptoms arise.   Flank Pain Flank pain refers to pain that is located on the side of the body between the upper abdomen and the back. The pain may occur over a short period of time (acute) or may be long-term or reoccurring (chronic). It may be mild or severe. Flank pain can be caused by many things. CAUSES  Some of the more common causes of flank pain include:  Muscle strains.   Muscle spasms.   A disease of your spine (vertebral disk disease).   A lung infection (pneumonia).   Fluid around your lungs (pulmonary edema).   A kidney infection.   Kidney stones.   A very painful skin rash caused by the chickenpox virus (shingles).   Gallbladder disease.  HOME CARE INSTRUCTIONS  Home care will depend on the cause of your pain. In general,  Rest as directed by your caregiver.  Drink enough fluids to keep your urine clear or pale yellow.  Only take over-the-counter or prescription medicines as directed by your caregiver. Some medicines may help relieve the pain.  Tell your caregiver about any changes in your pain.  Follow up with your caregiver as directed. SEEK IMMEDIATE MEDICAL CARE IF:   Your pain is not controlled with medicine.   You have new or worsening symptoms.  Your pain increases.   You have abdominal pain.   You have shortness of breath.   You  have persistent nausea or vomiting.   You have swelling in your abdomen.   You feel faint or pass out.   You have blood in your urine.  You have a fever or persistent symptoms for more than 2-3 days.  You have a fever and your symptoms suddenly get worse. MAKE SURE YOU:   Understand these instructions.  Will watch your condition.  Will get help right away if you are not doing well or get worse.   This information is not intended to replace advice given to you by your health care provider. Make sure you discuss any questions you have with your health care provider.   Document Released: 11/05/2005 Document Revised: 06/08/2012 Document Reviewed: 04/28/2012 Elsevier Interactive Patient Education [employer]2016 [employer] Inc.

## 2016-01-27 NOTE — ED Notes (Signed)
Pt refused medications.

## 2016-01-29 LAB — URINE CULTURE
Culture: 50000 — AB
Special Requests: NORMAL

## 2016-01-30 LAB — GLUCOSE, CAPILLARY: GLUCOSE-CAPILLARY: 140 mg/dL — AB (ref 65–99)

## 2016-03-21 ENCOUNTER — Emergency Department (HOSPITAL_COMMUNITY)
Admission: EM | Admit: 2016-03-21 | Discharge: 2016-03-28 | Disposition: E | Payer: Self-pay | Attending: Emergency Medicine | Admitting: Emergency Medicine

## 2016-03-21 DIAGNOSIS — I469 Cardiac arrest, cause unspecified: Secondary | ICD-10-CM | POA: Insufficient documentation

## 2016-03-21 MED ORDER — SODIUM CHLORIDE 0.9 % IV SOLN
INTRAVENOUS | Status: AC | PRN
  Administered 2016-03-21: 1000 mL via INTRAVENOUS

## 2016-03-21 MED ORDER — EPINEPHRINE HCL 0.1 MG/ML IJ SOSY
PREFILLED_SYRINGE | INTRAMUSCULAR | Status: AC | PRN
  Administered 2016-03-21 (×3): 1 via INTRAVENOUS

## 2016-03-21 MED ORDER — SODIUM BICARBONATE 8.4 % IV SOLN
INTRAVENOUS | Status: AC | PRN
  Administered 2016-03-21 (×2): 50 meq via INTRAVENOUS

## 2016-03-21 MED ORDER — CALCIUM CHLORIDE 10 % IV SOLN
INTRAVENOUS | Status: AC | PRN
  Administered 2016-03-21: 1 g via INTRAVENOUS

## 2016-03-23 MED FILL — Medication: Qty: 1 | Status: AC

## 2016-03-28 NOTE — ED Notes (Signed)
Bilateral eyes covered with moist saline gauze , post mortem care rendered , pt. placed in a bariatric body bag and labelled.

## 2016-03-28 NOTE — Code Documentation (Addendum)
Patient's belongings at bedside include 1 $10 bill placed in security safe. One white shirt, one pair black pants thrown away

## 2016-03-28 NOTE — ED Notes (Signed)
Father Roswell NickelGerald Denz Sr. [phone]

## 2016-03-28 NOTE — Code Documentation (Signed)
Minimal cardiac activity on US per Dr. Preston FleetingGlick

## 2016-03-28 NOTE — Progress Notes (Signed)
   Jun 26, 2016 0200  Clinical Encounter Type  Visited With Patient;Family  Visit Type Death;ED  Referral From Nurse  Consult/Referral To Chaplain  Spiritual Encounters  Spiritual Needs Emotional;Grief support  Stress Factors  Patient Stress Factors Loss  Family Stress Factors Loss   Chaplain responded to Level 1 trauma CPR in progress.  Pt did not respond to CPR. Pt passed. Chaplain brought family to Sub Waiting area for physician.  Chaplain offered ministry of presence to family while physician spoke to them. Chaplain walked with family out of hospital and then back to room to view body.  Family too distraught to give any funeral information.    Rosezella FloridaLisa M [facility] 07/24/2016 2:41 AM

## 2016-03-28 NOTE — ED Provider Notes (Addendum)
CSN: [id number]     Arrival date & time 2015/12/25  0119 History  By signing my name below, I, Paul Turner, attest that this documentation has been prepared under the direction and in the presence of Paul Boozeavid Syan Cullimore, MD. Electronically Signed: Bethel BornBritney Turner, ED Scribe. 01/31/16. 2:17 AM   Chief Complaint: CPR in progress  The history is provided by the EMS personnel. The history is limited by the condition of the patient. No language interpreter was used.   LEVEL V CAVEAT DUE TO THE ACUITY OF THE PRESENTING CONDITION AND UNRESPONSIVENESS  Brought in by EMS from home with CPR in progress, Paul Turner is a 40 y.o. male who presents to the Emergency Department for evaluation of cardiac arrest. The pt was found unresponsive by his children who had seen him well 30 minutes prior. Family started CPR before EMS arrival. Pt has received 45 minutes of CPR by EMS. They last noted pulses 20 minutes PTA. At that time he had pulses for approximately 1 minute before losing them. He received Narcan and 10 rounds of epinephrine PTA. Last dose of epinephrine was given as he was being brought into the department.  No past medical history on file. No past surgical history on file. No family history on file. Social History  Substance Use Topics  . Smoking status: Not on file  . Smokeless tobacco: Not on file  . Alcohol Use: Not on file    Review of Systems  Unable to perform ROS: Patient unresponsive   Allergies  Review of patient's allergies indicates not on file.  Home Medications   Prior to Admission medications   Not on File   BP 0/0 mmHg  Pulse 0  Temp(Src) 96 F (35.6 C) (Axillary)  Resp 0  SpO2 0% Physical Exam  Constitutional:  Morbidly obese CPR in progress  HENT:  Head: Normocephalic and atraumatic.  King airway in place   Eyes:  Pupils mid positioned and fixed   Neck: No JVD present.  Cardiovascular:  Heart tones are absent  Pulmonary/Chest:  Breath sounds distant  with little air movement  Abdominal: Soft. He exhibits no distension.  Obese  Musculoskeletal:  2+ pitting edema   Neurological:  GCS= 3  Skin: Skin is dry. No rash noted.  Nursing note and vitals reviewed.   ED Course  Procedures (including critical care time) INTUBATION Performed by: [facility] IncGLICK,Fenix Ruppe  Required items: required blood products, implants, devices, and special equipment available Patient identity confirmed: provided demographic data and hospital-assigned identification number Time out: Immediately prior to procedure a "time out" was called to verify the correct patient, procedure, equipment, support staff and site/side marked as required.  Indications: Cardiopulmonary arrest   Intubation method: Glidescope Laryngoscopy, direct laryngoscopy. Technically very difficult intubation. Patient with for a large tongue which made visualization of cords difficult with kaleidoscope. Once cords were visualized, unable to position ET tube in the line of sight of the kaleidoscope. Glottiscope was abandoned and patient intubated with direct laryngoscopy. Still technically difficult, but patient successfully intubated with first attempt.   Preoxygenation: BVM  Sedatives: None  Paralytic: None   Tube Size: 7.5 cuffed  Post-procedure assessment: chest rise and ETCO2 monitor Breath sounds: equal and absent over the epigastrium Tube secured with: ETT holder Chest x-ray was not obtained because of ongoing CPR   Patient tolerated the procedure well with no immediate complications.   Cardiopulmonary Resuscitation (CPR) Procedure Note Directed/Performed by: Paul BoozeGLICK,Tammatha Cobb I personally directed ancillary staff and/or performed CPR in an effort to  regain return of spontaneous circulation and to maintain cardiac, neuro and systemic perfusion.    MDM   Final diagnoses:  Cardiopulmonary arrest (HCC)  Morbid obesity, unspecified obesity type [facility](HCC)    Patient presenting with CPR in progress  from out of hospital cardiac arrest. There were several locations of brief return of spontaneous circulation, but he arrived with 45 minutes of total CPR and 20 minutes since last return of spontaneous circulation. He has received approximately 10 doses of epinephrine and one dose of naloxone. In the ED, CPR was continued and he was noted to be in asystole. Intraosseous line was in place. Airway was noted to be suboptimal, soaking airway was removed and the patient was given endotracheal intubation. He was given additional epinephrine as well as sodium bicarbonate. With accommodation of these measures, there was return of electrical activity but without palpable pulses. He was given calcium chloride without any clinical improvement. Visit echocardiogram showed minimal cardiac contractions. Images were not archived. At this point, it was felt the patient demonstrated unresponsiveness to 4 ACLS measures and he was pronounced dead at 01 38 and all resuscitative efforts ceased. This is a medical rest and not a medical examiner's case. Old records were reviewed and he does have a known history of sleep apnea. There was a fairly strong odor of ethanol and it is felt that he had a combination of sleep apnea, aspiration and possible acute cardiac event. Family have arrived and I have discussed the events and outcome with them. He has no PCP.  I personally performed the services described in this documentation, which was scribed in my presence. The recorded information has been reviewed and is accurate.      Paul Boozeavid Mana Morison, MD Feb 14, 2016 [id number]  Paul Boozeavid Paul Bloxham, MD Feb 14, 2016 [id number]

## 2016-03-28 NOTE — ED Notes (Signed)
Transported to morgue.

## 2016-03-28 NOTE — Code Documentation (Signed)
Patient time of death occurred at 0138, called by Dr. Preston FleetingGlick

## 2016-03-28 NOTE — ED Notes (Signed)
Pt arrives via EMS as an active code, found by children unresponsive and cyanotic in the face and hands. Family had left him at home for approx 30 minutes while at the store, came back and found in down. EMS arrived and continued CPR, initial rhythm asystole. Continued CPR for approx 45 minutes before arrival to this department, with 3 brief periods of ROSC in which patient was found in ventricular rhythm at 200 BPM before bradying down and arresting again in PEA or asystole. Pt received 10 epis, 1 round narcan, and 1500ML cold NS through an IO in the R tibia PTA. [facility]. EMS noted capnography in 90-100 range throughout entire code.

## 2016-03-28 DEATH — deceased

## 2016-07-28 IMAGING — CR DG SHOULDER 3+V*R*
1 series · 3 of 3 positions shown · non-contrast
Comparison: 04/22/2014

CLINICAL DATA: Right shoulder pain, fall off bed

EXAM:
DG SHOULDER 3+ VIEWS RIGHT

[Series 1: w shoulder grashey right · 0.14mm/px · 3 of 3 slices shown]
[im 1/3]
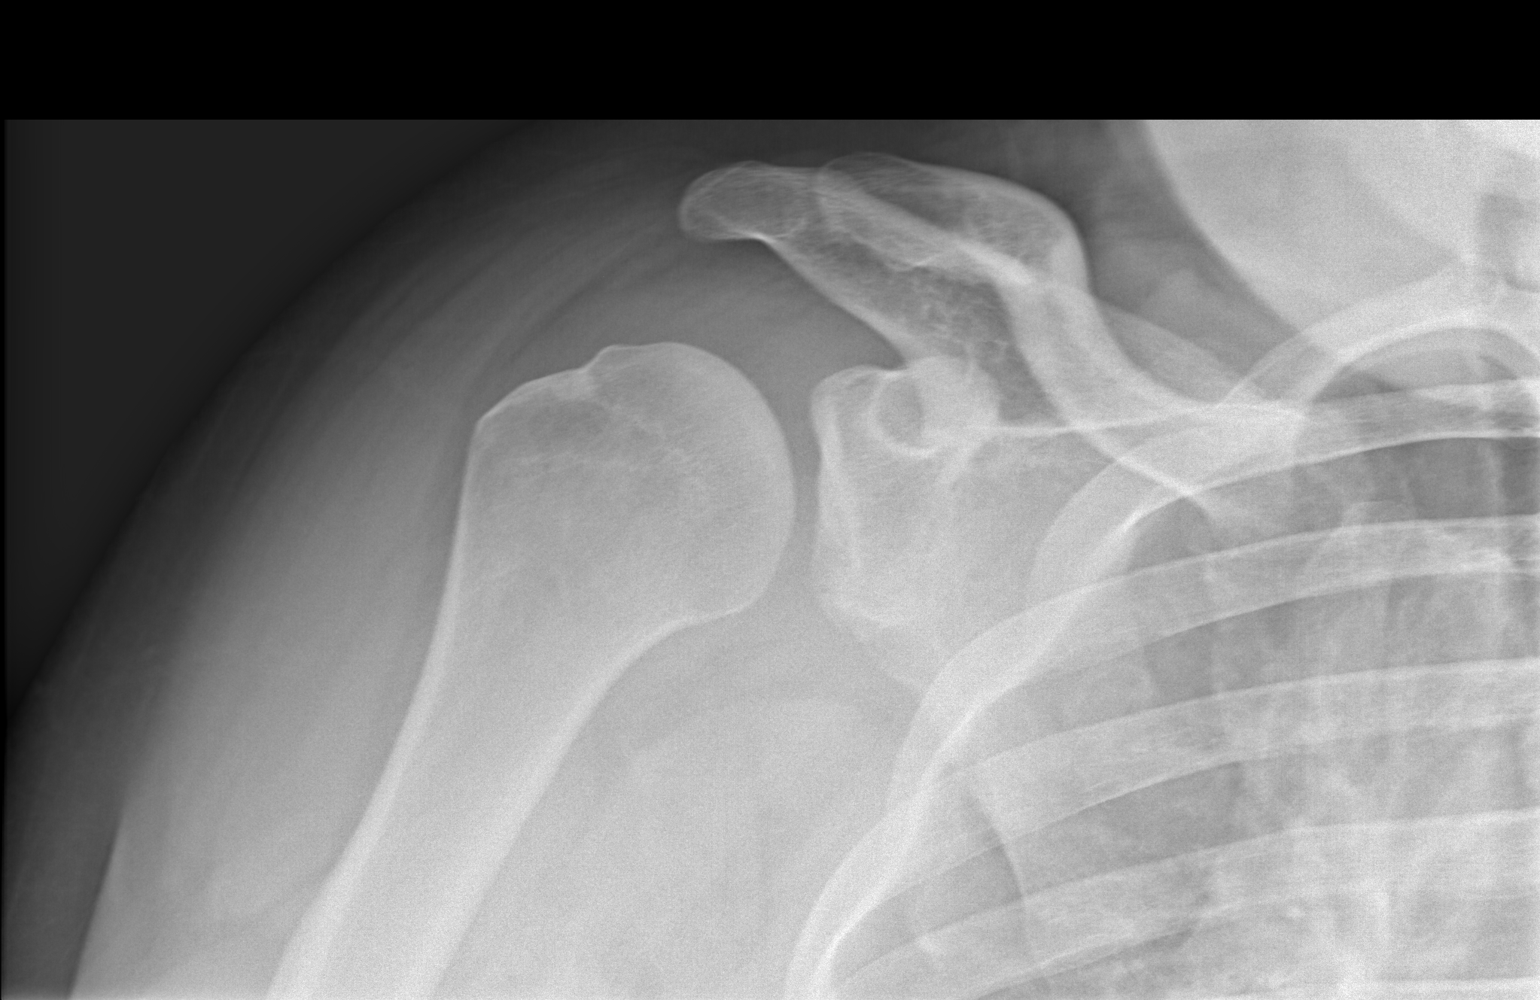
[im 2/3]
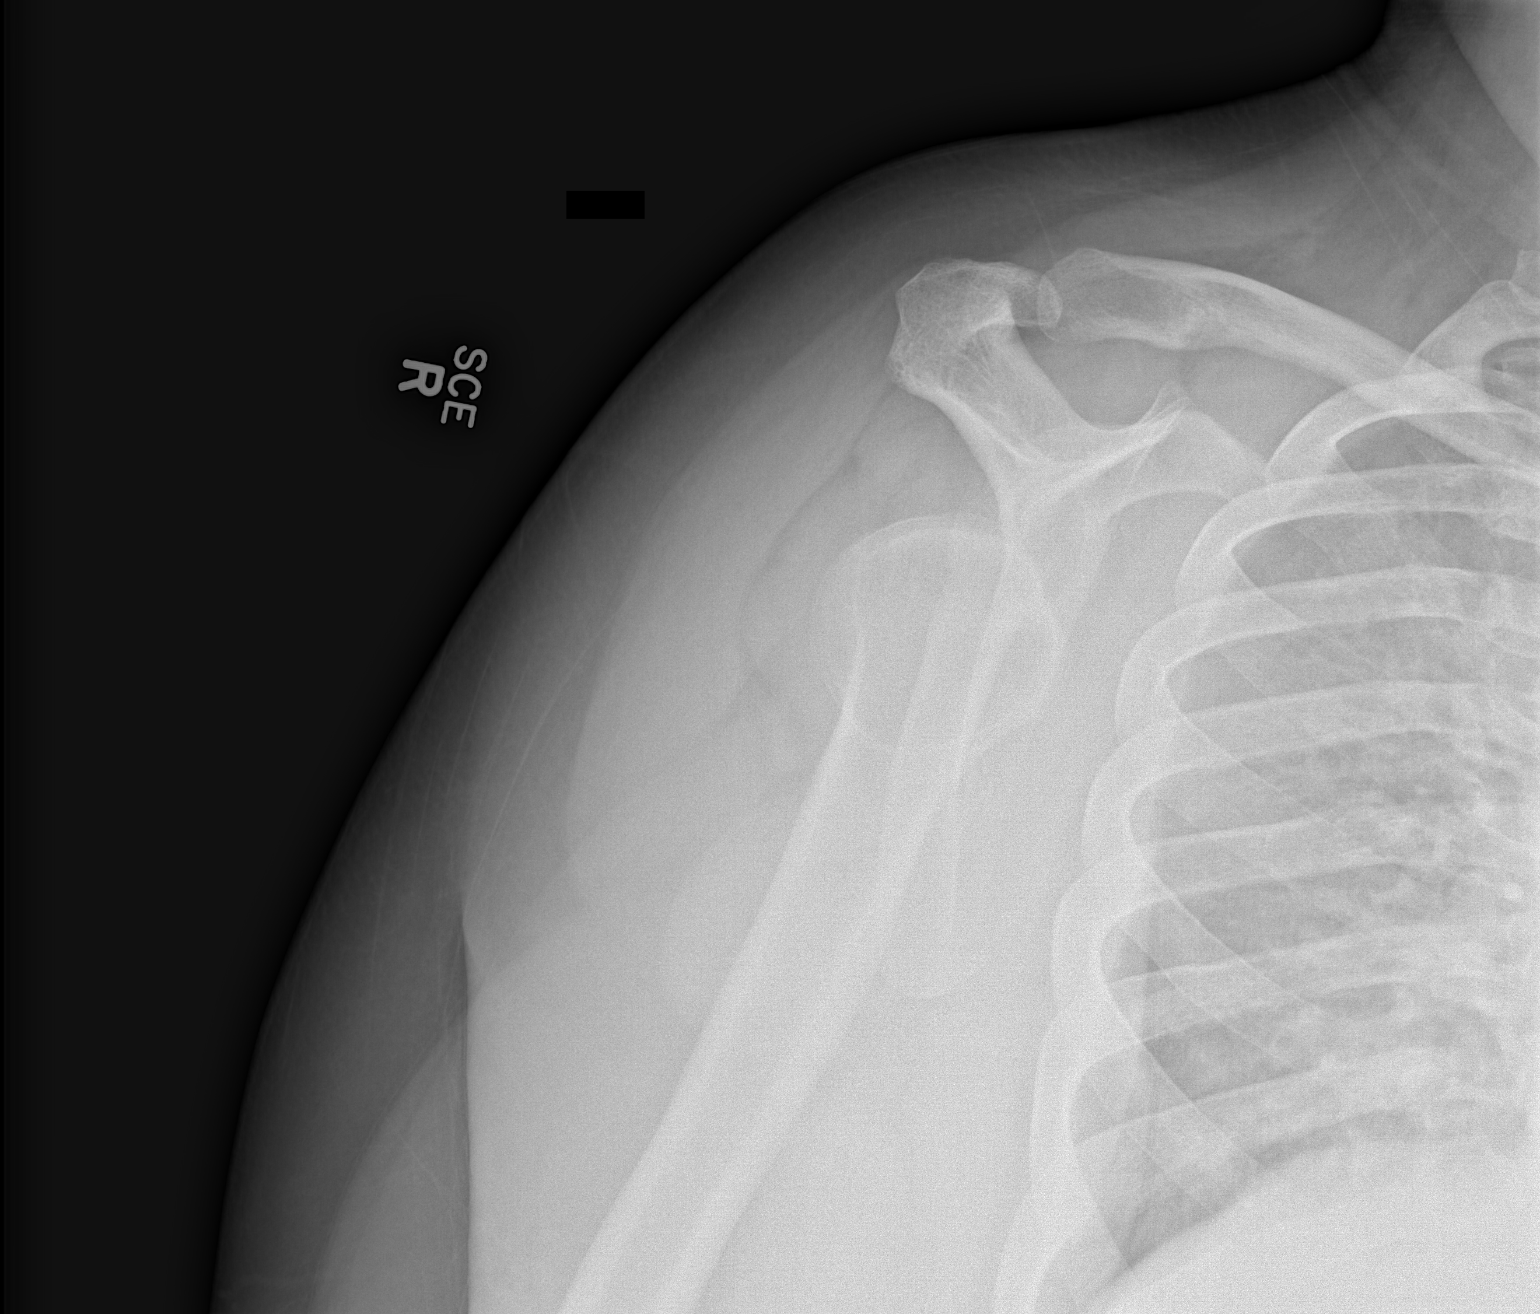
[im 3/3]
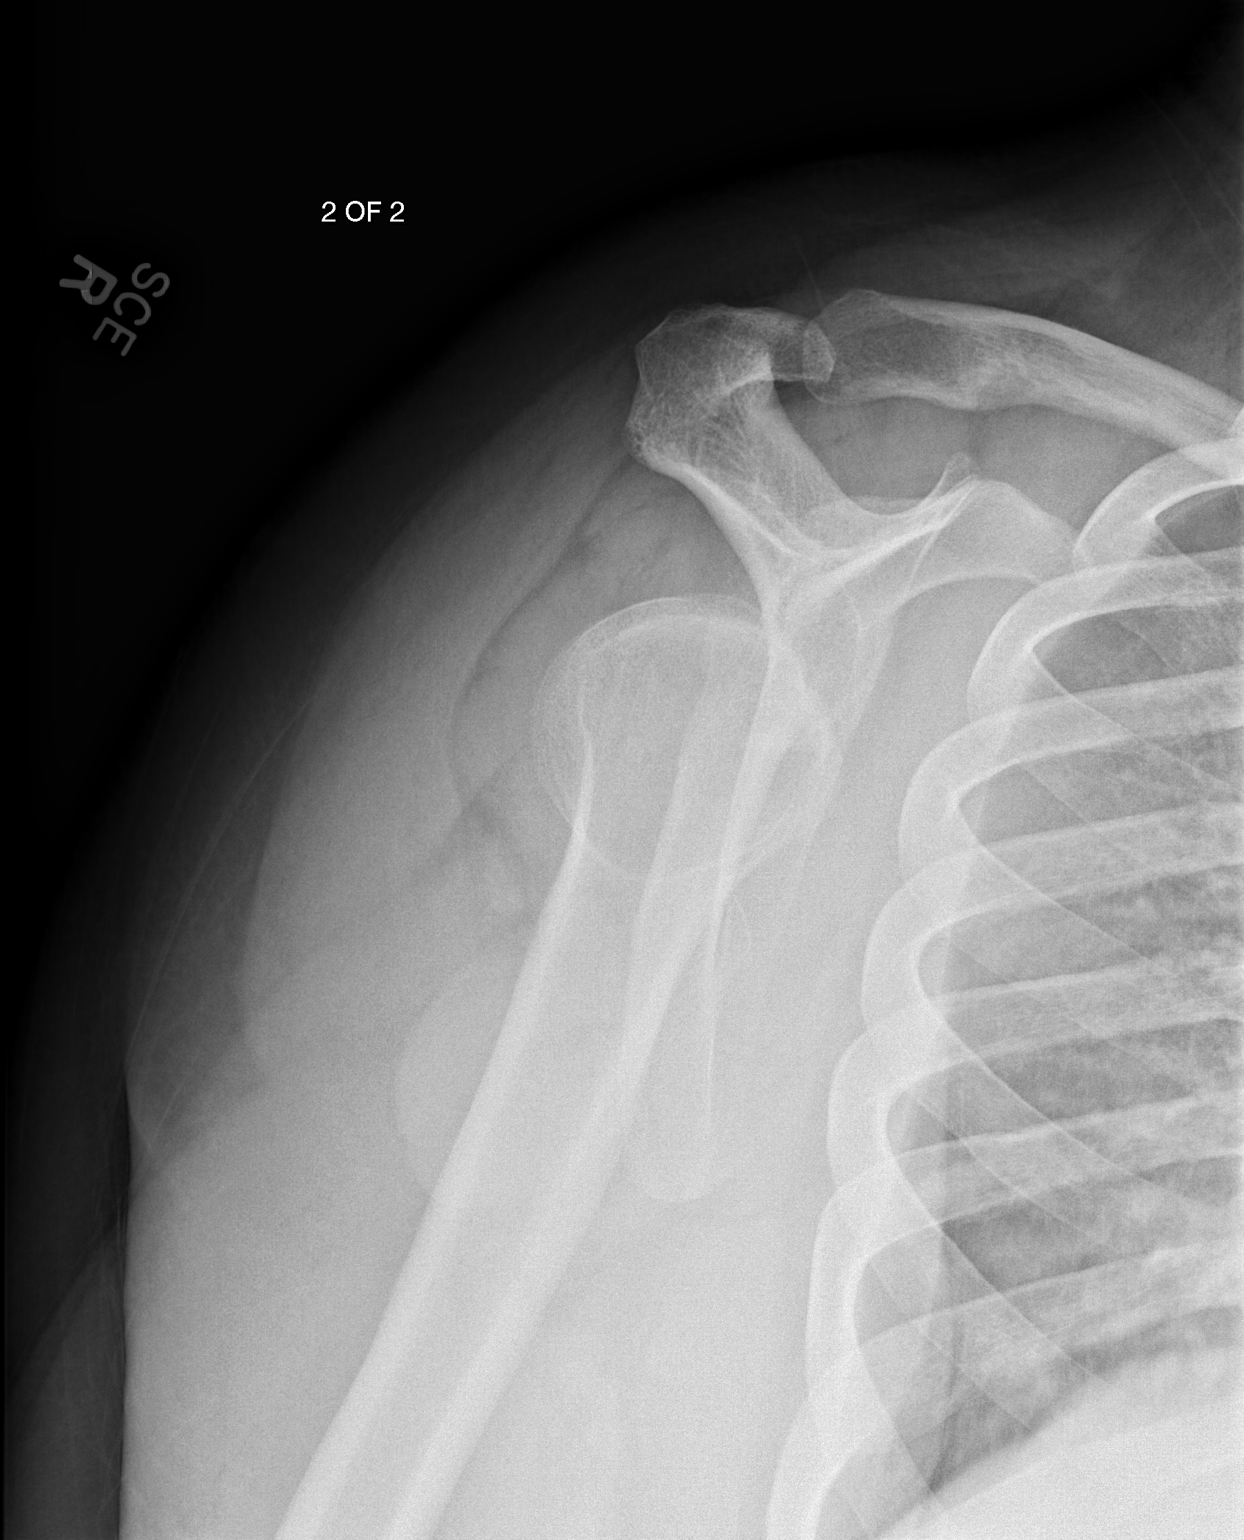

[3 of 3 positions shown; findings below may reference images not displayed]

FINDINGS: There is apparent anteroinferior dislocation of the right humeral
head with respect to the glenoid. Surrounding soft tissue swelling
and probable joint effusion is reidentified. AC joint distance is
normal. Right lung apex is clear.
IMPRESSION: Apparent anteroinferior right humeral head dislocation.

## 2017-09-04 IMAGING — CT CT RENAL STONE PROTOCOL
2 of 4 series · 17 of 46 positions shown, 19 images · non-contrast
Comparison: None.

CLINICAL DATA: Acute right flank pain.

EXAM:
CT ABDOMEN AND PELVIS WITHOUT CONTRAST
TECHNIQUE: Multidetector CT imaging of the abdomen and pelvis was performed
following the standard protocol without IV contrast.

[Series 2: routine abd pel wo · axial · 0.98mm/px · z∈[-1309,-864]mm · 14 of 97 slices shown, 16 images]
[im 4/97  soft-tissue]
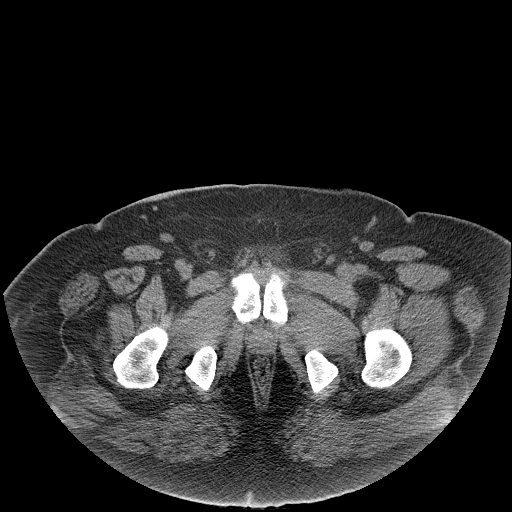
[im 4/97  bone]
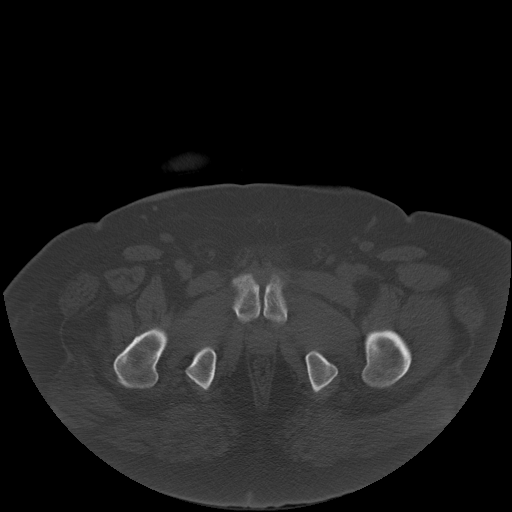
[im 12/97  soft-tissue]
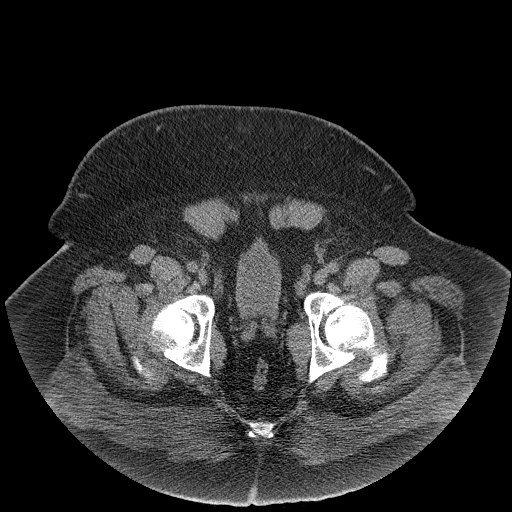
[im 20/97  soft-tissue]
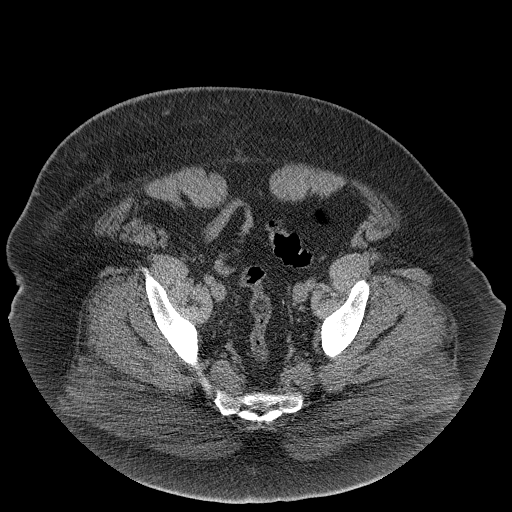
[im 27/97  soft-tissue]
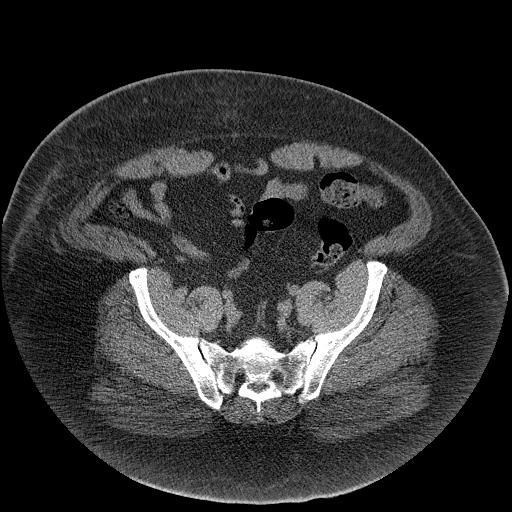
[im 31/97  soft-tissue]
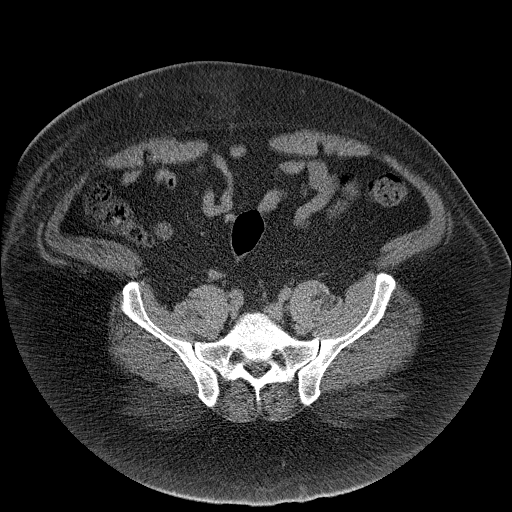
[im 39/97  soft-tissue]
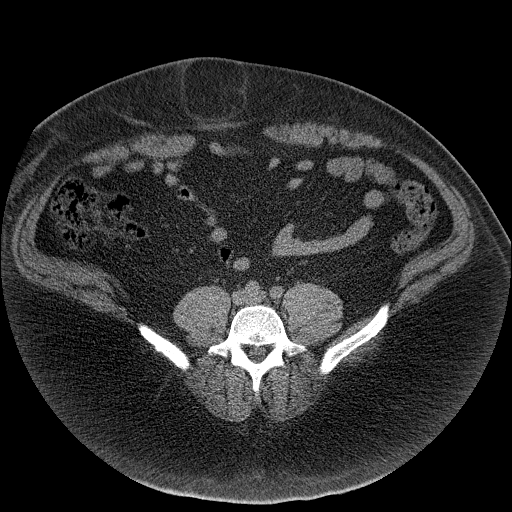
[im 47/97  soft-tissue]
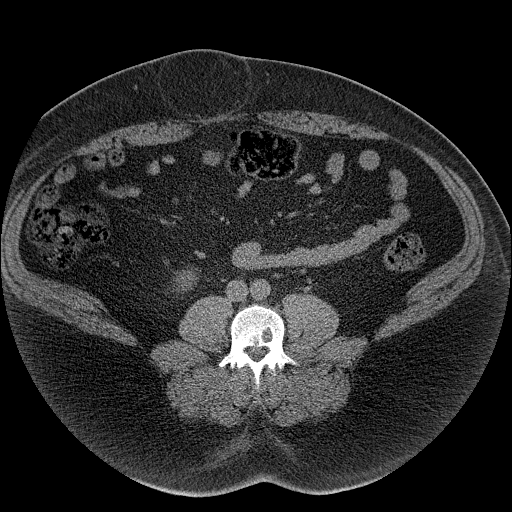
[im 50/97  soft-tissue]
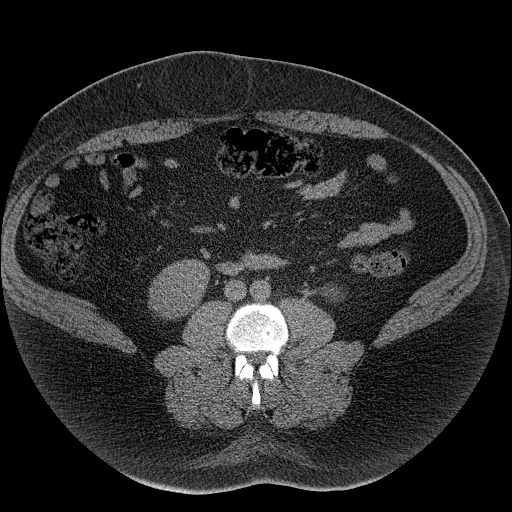
[im 58/97  soft-tissue]
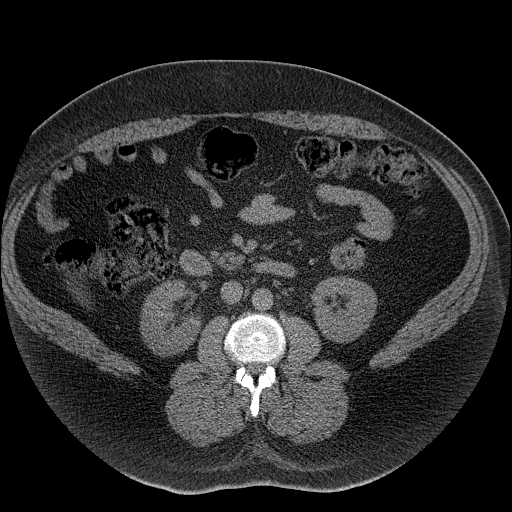
[im 58/97  bone]
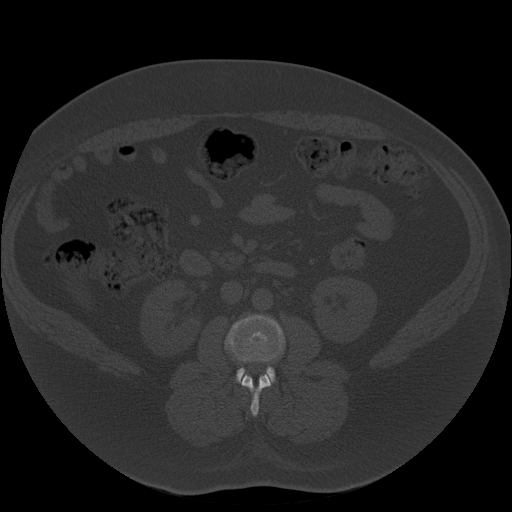
[im 66/97  soft-tissue]
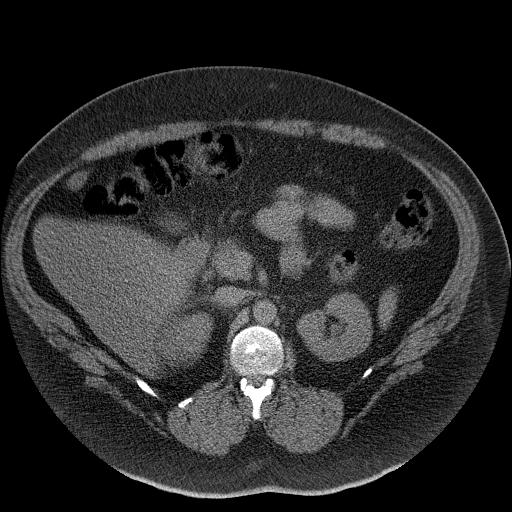
[im 73/97  soft-tissue]
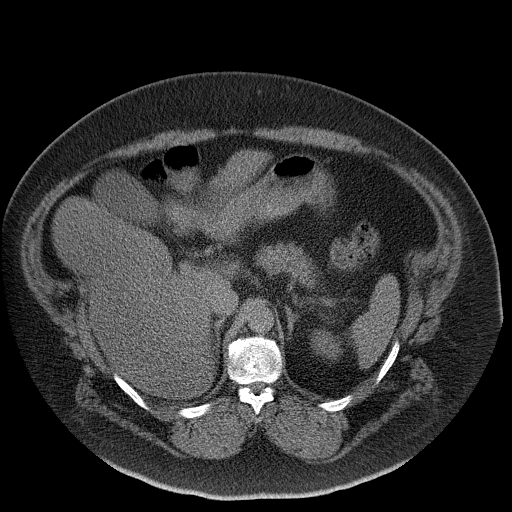
[im 77/97  soft-tissue]
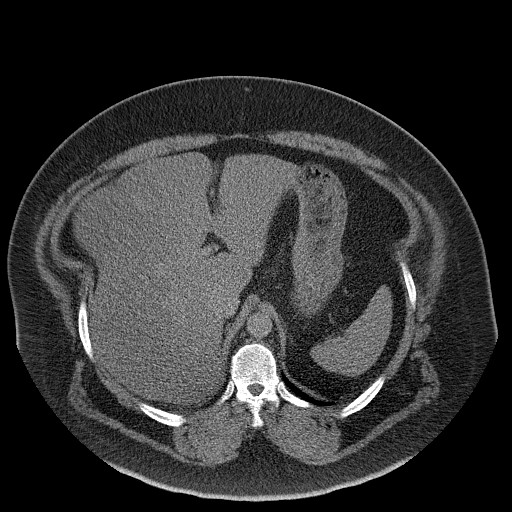
[im 85/97  soft-tissue]
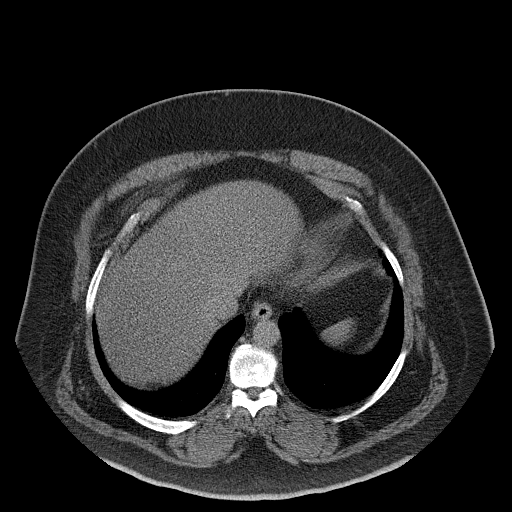
[im 93/97  soft-tissue]
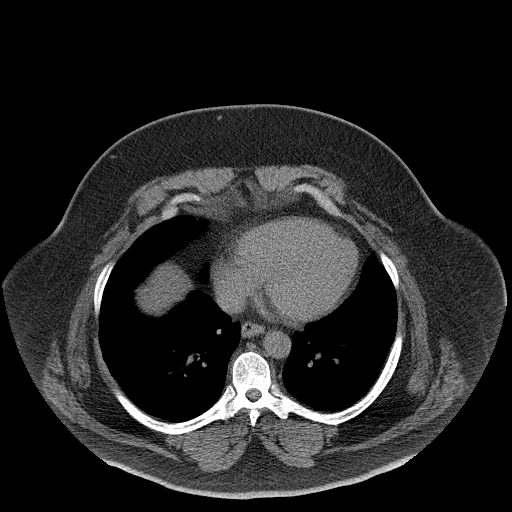

[Series 5: cor routine abd pel wo · coronal · 0.94mm/px · 3 of 208 slices shown]
[im 70/208  soft-tissue]
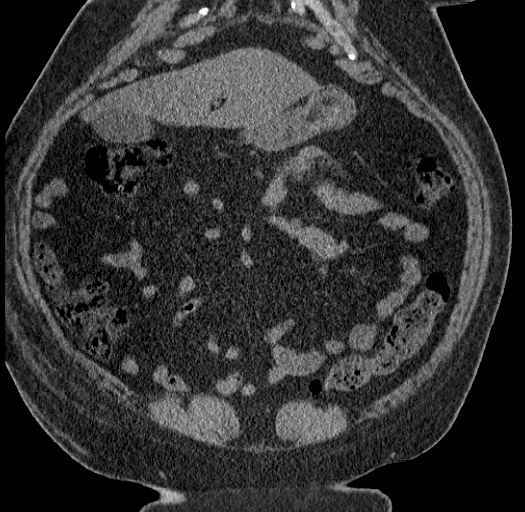
[im 93/208  soft-tissue]
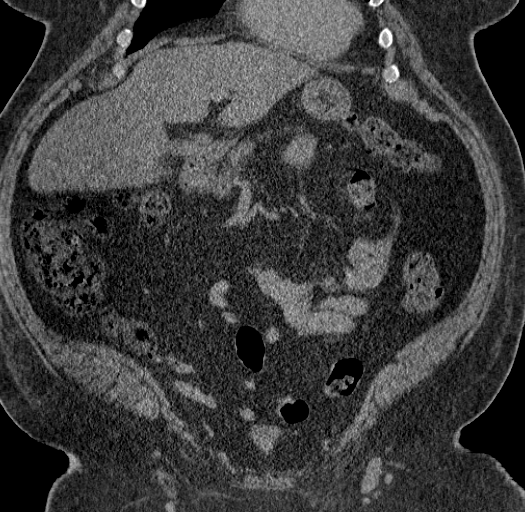
[im 116/208  soft-tissue]
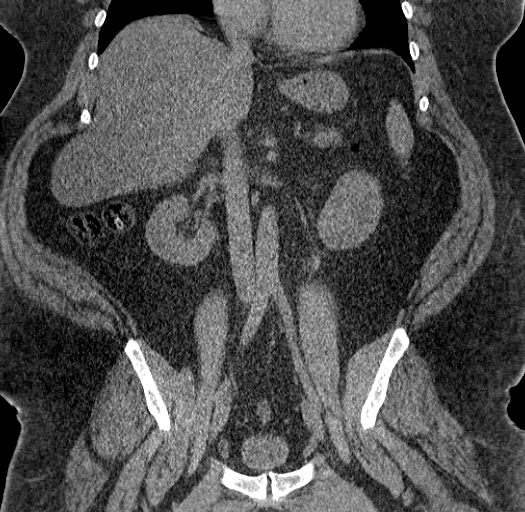

[17 of 46 positions shown; findings below may reference images not displayed]

FINDINGS: Visualized lung bases appear normal. No significant osseous
abnormality is noted.

No gallstones are noted. Fatty infiltration of the liver is noted.
The spleen and pancreas appear normal. Adrenal glands and kidneys
appear normal. No hydronephrosis or renal obstruction is noted. No
renal or ureteral calculi are noted. The appendix appears normal.
There is no evidence of bowel obstruction. Large periumbilical fat
containing hernia is noted. No abnormal fluid collection is noted.
Urinary bladder is decompressed. No significant adenopathy is noted.
IMPRESSION: No hydronephrosis or renal obstruction is noted. No renal or
ureteral calculi are noted.

Large fat containing periumbilical hernia is noted.

Probable fatty infiltration of the liver.
